# Patient Record
Sex: Male | Born: 1985 | Race: White | Hispanic: No | Marital: Single | Smoking: Light tobacco smoker
Health system: Southern US, Community
[De-identification: ages and names within clinical notes are randomized; demographics above are authoritative.]

## PROBLEM LIST (undated history)

## (undated) DIAGNOSIS — E781 Pure hyperglyceridemia: Secondary | ICD-10-CM

## (undated) DIAGNOSIS — I10 Essential (primary) hypertension: Secondary | ICD-10-CM

## (undated) DIAGNOSIS — F909 Attention-deficit hyperactivity disorder, unspecified type: Secondary | ICD-10-CM

---

## 2006-12-17 ENCOUNTER — Encounter: Admission: RE | Admit: 2006-12-17 | Discharge: 2006-12-17 | Payer: Self-pay | Admitting: Family Medicine

## 2009-04-04 ENCOUNTER — Encounter: Admission: RE | Admit: 2009-04-04 | Discharge: 2009-04-04 | Payer: Self-pay | Admitting: Occupational Medicine

## 2010-03-24 ENCOUNTER — Encounter: Admission: RE | Admit: 2010-03-24 | Discharge: 2010-03-24 | Payer: Self-pay | Admitting: Family Medicine

## 2014-10-13 ENCOUNTER — Other Ambulatory Visit: Payer: Self-pay | Admitting: Family Medicine

## 2014-10-13 DIAGNOSIS — G4484 Primary exertional headache: Secondary | ICD-10-CM

## 2014-10-15 ENCOUNTER — Ambulatory Visit
Admission: RE | Admit: 2014-10-15 | Discharge: 2014-10-15 | Disposition: A | Discharge status: Home / Self Care | Payer: BLUE CROSS/BLUE SHIELD | Source: Ambulatory Visit | Attending: Family Medicine | Admitting: Family Medicine

## 2014-10-15 DIAGNOSIS — G4484 Primary exertional headache: Secondary | ICD-10-CM

## 2014-10-21 ENCOUNTER — Other Ambulatory Visit: Payer: Self-pay | Admitting: Family Medicine

## 2014-10-21 DIAGNOSIS — G4484 Primary exertional headache: Secondary | ICD-10-CM

## 2014-11-02 ENCOUNTER — Other Ambulatory Visit: Payer: BLUE CROSS/BLUE SHIELD

## 2016-07-16 DIAGNOSIS — Z0000 Encounter for general adult medical examination without abnormal findings: Secondary | ICD-10-CM | POA: Diagnosis not present

## 2016-07-16 DIAGNOSIS — Z131 Encounter for screening for diabetes mellitus: Secondary | ICD-10-CM | POA: Diagnosis not present

## 2016-07-16 DIAGNOSIS — R5383 Other fatigue: Secondary | ICD-10-CM | POA: Diagnosis not present

## 2016-07-16 DIAGNOSIS — Z13220 Encounter for screening for lipoid disorders: Secondary | ICD-10-CM | POA: Diagnosis not present

## 2016-07-16 DIAGNOSIS — Z23 Encounter for immunization: Secondary | ICD-10-CM | POA: Diagnosis not present

## 2016-07-16 DIAGNOSIS — E782 Mixed hyperlipidemia: Secondary | ICD-10-CM | POA: Diagnosis not present

## 2016-08-22 DIAGNOSIS — E785 Hyperlipidemia, unspecified: Secondary | ICD-10-CM | POA: Diagnosis not present

## 2017-06-11 DIAGNOSIS — J209 Acute bronchitis, unspecified: Secondary | ICD-10-CM | POA: Diagnosis not present

## 2017-07-24 DIAGNOSIS — R7309 Other abnormal glucose: Secondary | ICD-10-CM | POA: Diagnosis not present

## 2017-07-24 DIAGNOSIS — E782 Mixed hyperlipidemia: Secondary | ICD-10-CM | POA: Diagnosis not present

## 2017-07-24 DIAGNOSIS — E781 Pure hyperglyceridemia: Secondary | ICD-10-CM | POA: Diagnosis not present

## 2017-07-24 DIAGNOSIS — Z0000 Encounter for general adult medical examination without abnormal findings: Secondary | ICD-10-CM | POA: Diagnosis not present

## 2017-09-04 DIAGNOSIS — E785 Hyperlipidemia, unspecified: Secondary | ICD-10-CM | POA: Diagnosis not present

## 2018-07-29 DIAGNOSIS — R7309 Other abnormal glucose: Secondary | ICD-10-CM | POA: Diagnosis not present

## 2018-07-29 DIAGNOSIS — E782 Mixed hyperlipidemia: Secondary | ICD-10-CM | POA: Diagnosis not present

## 2018-07-29 DIAGNOSIS — Z0000 Encounter for general adult medical examination without abnormal findings: Secondary | ICD-10-CM | POA: Diagnosis not present

## 2018-09-05 DIAGNOSIS — F419 Anxiety disorder, unspecified: Secondary | ICD-10-CM | POA: Diagnosis not present

## 2018-09-05 DIAGNOSIS — R41840 Attention and concentration deficit: Secondary | ICD-10-CM | POA: Diagnosis not present

## 2018-10-03 DIAGNOSIS — F902 Attention-deficit hyperactivity disorder, combined type: Secondary | ICD-10-CM | POA: Diagnosis not present

## 2018-10-03 DIAGNOSIS — E785 Hyperlipidemia, unspecified: Secondary | ICD-10-CM | POA: Diagnosis not present

## 2018-10-03 DIAGNOSIS — Z79899 Other long term (current) drug therapy: Secondary | ICD-10-CM | POA: Diagnosis not present

## 2018-10-03 DIAGNOSIS — I10 Essential (primary) hypertension: Secondary | ICD-10-CM | POA: Diagnosis not present

## 2018-12-26 DIAGNOSIS — Z79899 Other long term (current) drug therapy: Secondary | ICD-10-CM | POA: Diagnosis not present

## 2018-12-26 DIAGNOSIS — F902 Attention-deficit hyperactivity disorder, combined type: Secondary | ICD-10-CM | POA: Diagnosis not present

## 2019-01-27 DIAGNOSIS — R062 Wheezing: Secondary | ICD-10-CM | POA: Diagnosis not present

## 2019-01-27 DIAGNOSIS — J988 Other specified respiratory disorders: Secondary | ICD-10-CM | POA: Diagnosis not present

## 2019-01-27 DIAGNOSIS — R05 Cough: Secondary | ICD-10-CM | POA: Diagnosis not present

## 2019-03-27 DIAGNOSIS — E785 Hyperlipidemia, unspecified: Secondary | ICD-10-CM | POA: Diagnosis not present

## 2019-04-24 DIAGNOSIS — Z79899 Other long term (current) drug therapy: Secondary | ICD-10-CM | POA: Diagnosis not present

## 2019-04-24 DIAGNOSIS — F419 Anxiety disorder, unspecified: Secondary | ICD-10-CM | POA: Diagnosis not present

## 2019-04-24 DIAGNOSIS — F902 Attention-deficit hyperactivity disorder, combined type: Secondary | ICD-10-CM | POA: Diagnosis not present

## 2019-06-12 DIAGNOSIS — M791 Myalgia, unspecified site: Secondary | ICD-10-CM | POA: Diagnosis not present

## 2019-06-12 DIAGNOSIS — R05 Cough: Secondary | ICD-10-CM | POA: Diagnosis not present

## 2019-06-15 DIAGNOSIS — R05 Cough: Secondary | ICD-10-CM | POA: Diagnosis not present

## 2019-06-19 DIAGNOSIS — U071 COVID-19: Secondary | ICD-10-CM | POA: Diagnosis not present

## 2019-07-03 ENCOUNTER — Emergency Department (HOSPITAL_BASED_OUTPATIENT_CLINIC_OR_DEPARTMENT_OTHER)
Admission: EM | Admit: 2019-07-03 | Discharge: 2019-07-03 | Disposition: A | Discharge status: Home / Self Care | Payer: BC Managed Care – PPO | Attending: Emergency Medicine | Admitting: Emergency Medicine

## 2019-07-03 ENCOUNTER — Other Ambulatory Visit: Payer: Self-pay

## 2019-07-03 ENCOUNTER — Encounter (HOSPITAL_BASED_OUTPATIENT_CLINIC_OR_DEPARTMENT_OTHER): Payer: Self-pay | Admitting: Emergency Medicine

## 2019-07-03 ENCOUNTER — Emergency Department (HOSPITAL_BASED_OUTPATIENT_CLINIC_OR_DEPARTMENT_OTHER): Payer: BC Managed Care – PPO

## 2019-07-03 DIAGNOSIS — R0609 Other forms of dyspnea: Secondary | ICD-10-CM

## 2019-07-03 DIAGNOSIS — Z79899 Other long term (current) drug therapy: Secondary | ICD-10-CM | POA: Insufficient documentation

## 2019-07-03 DIAGNOSIS — F17210 Nicotine dependence, cigarettes, uncomplicated: Secondary | ICD-10-CM | POA: Diagnosis not present

## 2019-07-03 DIAGNOSIS — M940 Chondrocostal junction syndrome [Tietze]: Secondary | ICD-10-CM

## 2019-07-03 DIAGNOSIS — R0602 Shortness of breath: Secondary | ICD-10-CM | POA: Diagnosis not present

## 2019-07-03 DIAGNOSIS — R072 Precordial pain: Secondary | ICD-10-CM | POA: Diagnosis not present

## 2019-07-03 DIAGNOSIS — Z20828 Contact with and (suspected) exposure to other viral communicable diseases: Secondary | ICD-10-CM | POA: Insufficient documentation

## 2019-07-03 DIAGNOSIS — I10 Essential (primary) hypertension: Secondary | ICD-10-CM | POA: Diagnosis not present

## 2019-07-03 DIAGNOSIS — R0600 Dyspnea, unspecified: Secondary | ICD-10-CM | POA: Diagnosis not present

## 2019-07-03 DIAGNOSIS — R079 Chest pain, unspecified: Secondary | ICD-10-CM | POA: Insufficient documentation

## 2019-07-03 DIAGNOSIS — U071 COVID-19: Secondary | ICD-10-CM

## 2019-07-03 HISTORY — DX: Pure hyperglyceridemia: E78.1

## 2019-07-03 HISTORY — DX: Essential (primary) hypertension: I10

## 2019-07-03 HISTORY — DX: Attention-deficit hyperactivity disorder, unspecified type: F90.9

## 2019-07-03 LAB — BASIC METABOLIC PANEL
Anion gap: 8 (ref 5–15)
BUN: 15 mg/dL (ref 6–20)
CO2: 27 mmol/L (ref 22–32)
Calcium: 9.7 mg/dL (ref 8.9–10.3)
Chloride: 103 mmol/L (ref 98–111)
Creatinine, Ser: 0.99 mg/dL (ref 0.61–1.24)
GFR calc Af Amer: >60 mL/min (ref >60)
GFR calc non Af Amer: >60 mL/min (ref >60)
Glucose, Bld: 119 mg/dL — ABNORMAL HIGH (ref 70–99)
Potassium: 4.4 mmol/L (ref 3.5–5.1)
Sodium: 138 mmol/L (ref 135–145)

## 2019-07-03 LAB — URINALYSIS, ROUTINE W REFLEX MICROSCOPIC
Bilirubin Urine: NEGATIVE
Glucose, UA: NEGATIVE mg/dL
Hgb urine dipstick: NEGATIVE
Ketones, ur: NEGATIVE mg/dL
Leukocytes,Ua: NEGATIVE
Nitrite: NEGATIVE
Protein, ur: NEGATIVE mg/dL
Specific Gravity, Urine: <1.005 — ABNORMAL LOW (ref 1.005–1.030)
pH: 6 (ref 5.0–8.0)

## 2019-07-03 LAB — CBC WITH DIFFERENTIAL/PLATELET
Abs Immature Granulocytes: 0.01 10*3/uL (ref 0.00–0.07)
Basophils Absolute: 0 10*3/uL (ref 0.0–0.1)
Basophils Relative: 1 %
Eosinophils Absolute: 0.1 10*3/uL (ref 0.0–0.5)
Eosinophils Relative: 2 %
HCT: 40.3 % (ref 39.0–52.0)
Hemoglobin: 13.9 g/dL (ref 13.0–17.0)
Immature Granulocytes: 0 %
Lymphocytes Relative: 20 %
Lymphs Abs: 1 10*3/uL (ref 0.7–4.0)
MCH: 28.6 pg (ref 26.0–34.0)
MCHC: 34.5 g/dL (ref 30.0–36.0)
MCV: 82.9 fL (ref 80.0–100.0)
Monocytes Absolute: 0.4 10*3/uL (ref 0.1–1.0)
Monocytes Relative: 8 %
Neutro Abs: 3.4 10*3/uL (ref 1.7–7.7)
Neutrophils Relative %: 69 %
Platelets: 216 10*3/uL (ref 150–400)
RBC: 4.86 MIL/uL (ref 4.22–5.81)
RDW: 12.1 % (ref 11.5–15.5)
WBC: 5 10*3/uL (ref 4.0–10.5)
nRBC: 0 % (ref 0.0–0.2)

## 2019-07-03 LAB — TROPONIN I (HIGH SENSITIVITY): Troponin I (High Sensitivity): <2 ng/L (ref <18)

## 2019-07-03 LAB — D-DIMER, QUANTITATIVE: D-Dimer, Quant: 0.29 ug/mL-FEU (ref 0.00–0.50)

## 2019-07-03 NOTE — ED Provider Notes (Signed)
[facility]PART[facility] Provider Note   CSN: [id number] Arrival date & time: 07/03/19  1030     History   Chief Complaint No chief complaint on file.   HPI Lawrence Anderson is a 33 y.o. male with PMHx HTN who presents to the ED today complaining of gradual onset, constant, dull/achy, substernal chest pain x 2-3 days. Pt also complains of dyspnea on exertion.  Patient reports that he tested positive for Covid on 10/19.  He reports that 3 days prior (10/16) he began having a gradual cough and body aches which led him to go get tested.  He states that his symptoms improved about 10 days out after his test and he felt fine until earlier this week when he began having dull/achy pain to his chest.  He does report that the chest pain seems to worsen with deep inspiration.  No positional changes.  Ports he has never had pain like this in the past.  He does endorse family history of CAD.  Patient is a non-smoker.  He does endorse that he has been more immobile with his COVID-19 positive status.  Denies fever, chills, swelling, diaphoresis, nausea, vomiting, any other associated symptoms.  No history of DVT/PE.  No hemoptysis.  No active malignancy.  No exogenous hormone use.       Past Medical History:  Diagnosis Date  . ADHD   . High triglycerides   . Hypertension     There are no active problems to display for this patient.   History reviewed. No pertinent surgical history.      Home Medications    Prior to Admission medications   Medication Sig Start Date End Date Taking? Authorizing Provider  amphetamine-dextroamphetamine (ADDERALL XR) 30 MG 24 hr capsule Take 30 mg by mouth daily. 06/23/19  Yes [provider]  atorvastatin (LIPITOR) 10 MG tablet Take 10 mg by mouth daily.   Yes [provider]  lisinopril (ZESTRIL) 5 MG tablet Take 10 mg by mouth daily.    Yes [provider]    Family History No family history on file.  Social  History Social History   Tobacco Use  . Smoking status: Light Tobacco Smoker    Types: Cigars  . Smokeless tobacco: Never Used  . Tobacco comment: smokes x 2times a year  Substance Use Topics  . Alcohol use: Yes    Comment: social  . Drug use: Never     Allergies   Patient has no known allergies.   Review of Systems Review of Systems  Constitutional: Negative for chills and fever.  HENT: Negative for congestion.   Eyes: Negative for visual disturbance.  Respiratory: Positive for shortness of breath.   Cardiovascular: Positive for chest pain. Negative for palpitations and leg swelling.  Gastrointestinal: Negative for abdominal pain, nausea and vomiting.  Genitourinary: Negative for difficulty urinating.  Musculoskeletal: Negative for myalgias.  Skin: Negative for rash.  Neurological: Negative for syncope and headaches.     Physical Exam Updated Vital Signs BP (!) 133/91   Pulse 75   Temp 98.7 F (37.1 C) (Oral)   Resp 12   Ht 5\' 10"  (1.778 m)   Wt 113.4 kg   SpO2 99%   BMI 35.86 kg/m   Physical Exam Vitals signs and nursing note reviewed.  Constitutional:      Appearance: He is not ill-appearing or diaphoretic.  HENT:     Head: Normocephalic and atraumatic.  Eyes:     Conjunctiva/sclera:  Conjunctivae normal.  Neck:     Musculoskeletal: Neck supple.  Cardiovascular:     Rate and Rhythm: Normal rate and regular rhythm.     Pulses: Normal pulses.  Pulmonary:     Effort: Pulmonary effort is normal.     Breath sounds: Normal breath sounds. No wheezing, rhonchi or rales.     Comments: Satting 100% on RA. Able to speak in full sentences without difficulty.  Chest:     Chest wall: No tenderness.  Abdominal:     Palpations: Abdomen is soft.     Tenderness: There is no abdominal tenderness. There is no guarding or rebound.  Musculoskeletal:     Right lower leg: No edema.     Left lower leg: No edema.  Skin:    General: Skin is warm and dry.   Neurological:     Mental Status: He is alert.      ED Treatments / Results  Labs (all labs ordered are listed, but only abnormal results are displayed) Labs Reviewed  BASIC METABOLIC PANEL - Abnormal; Notable for the following components:      Result Value   Glucose, Bld 119 (*)    All other components within normal limits  URINALYSIS, ROUTINE W REFLEX MICROSCOPIC - Abnormal; Notable for the following components:   Color, Urine STRAW (*)    Specific Gravity, Urine <1.005 (*)    All other components within normal limits  CBC WITH DIFFERENTIAL/PLATELET  D-DIMER, QUANTITATIVE (NOT AT [facility])  TROPONIN I (HIGH SENSITIVITY)    EKG EKG Interpretation  Date/Time:  Friday July 03 2019 10:41:07 EST Ventricular Rate:  78 PR Interval:    QRS Duration: 97 QT Interval:  350 QTC Calculation: 399 R Axis:   28 Text Interpretation: Sinus rhythm ST elev, probable normal early repol pattern No previous ECGs available Confirmed by Frederick PeersLittle, Rachel [phone]([id number]) on 07/03/2019 10:51:40 AM   Radiology Dg Chest Portable 1 View  Result Date: 07/03/2019 CLINICAL DATA:  Shortness of breath and chest pain EXAM: PORTABLE CHEST 1 VIEW COMPARISON:  April 04, 2009 FINDINGS: Lungs are clear. Heart size and pulmonary vascularity are normal. No adenopathy. No bone lesions. No pneumothorax. IMPRESSION: No edema or consolidation. Electronically Signed   By: Bretta BangWilliam  Woodruff III M.D.   On: 07/03/2019 11:11    Procedures Procedures (including critical care time)  Medications Ordered in ED Medications - No data to display   Initial Impression / Assessment and Plan / ED Course  I have reviewed the triage vital signs and the nursing notes.  Pertinent labs & imaging results that were available during my care of the patient were reviewed by me and considered in my medical decision making (see chart for details).    33 year old male who presents the ED with complaints of dyspnea on exertion and tightness  substernally for the past 2 to 3 days.  Was tested positive for COVID-19 on 10/19 after cough, body aches.  Reports that he began feeling better about 1.5 weeks ago but a couple days ago started noticing dyspnea on exertion again.  Patient also has an occasional cough.  Exam patient appears comfortable.  He is able to speak in full sentences without accessory muscle use and satting 100% on room air.  He is afebrile today without tachycardia or tachypnea.  Patient describes the chest pain as a dull/achy sensation although does report it is worse with deep inspiration.  No tenderness to palpation on exam.  His lungs are clear to auscultation bilaterally.  Portable chest x-ray obtained prior to being seen - negative.  Given complaints of chest pain with recent COVID-19 infection will rule out ACS as well as PE today with D-dimer.   EKG without ischemic changes.  Again chest x-ray clear.  No electrolyte abnormalities.  No leukocytosis.  D-dimer negative. Do Not feel patient needs additional work-up with CTA at this time. Troponin less than 2.  Heart score of 2.  Do not feel patient needs additional troponin at this time.  Essentially negative work-up in the ED today.   Discussed these findings with patient.  I was initially planning on prescribing an albuterol inhaler as he has a slight cough in the room.  Patient states that he has an active inhaler but has not been using it as it makes him "jittery."  Advised to use this as needed.  Advised Tylenol as needed for the dull pain in his chest.  Patient may be experiencing some costochondritis from continued coughing from COVID-19.  Advised that he follow-up with his PCP as well.  It appears that he tried to make an appointment today but they sent him to the ED for further evaluation.  Return precautions have been discussed with patient.  He is in agreement with plan at this time stable for discharge home.   This note was prepared using Dragon voice recognition  software and may include unintentional dictation errors due to the inherent limitations of voice recognition software.        Final Clinical Impressions(s) / ED Diagnoses   Final diagnoses:  Nonspecific chest pain  Costochondritis  Dyspnea on exertion  COVID-19    ED Discharge Orders    None       Tanda Rockers, PA-C 07/03/19 1309    Little, Ambrose [place], MD 07/03/19 1409

## 2019-07-03 NOTE — ED Notes (Signed)
XR at bedside

## 2019-07-03 NOTE — ED Triage Notes (Signed)
Fatigue, with SOB on exertion, since Monday. Having a 'tightness' in chest x 3 days. No fever or cough. Dx with +COVID test on 06/15/19

## 2019-07-03 NOTE — Discharge Instructions (Signed)
Your labwork, EKG, and chest xray were all very reassuring today Please use your inhaler as needed for your shortness of breath. Continue taking OTC medications for your cough.  Take Tylenol for your dull chest pain as this may be related to inflammation from coughing.  Follow up with your PCP regarding your ED visit today

## 2019-07-21 DIAGNOSIS — Z79899 Other long term (current) drug therapy: Secondary | ICD-10-CM | POA: Diagnosis not present

## 2019-07-21 DIAGNOSIS — F902 Attention-deficit hyperactivity disorder, combined type: Secondary | ICD-10-CM | POA: Diagnosis not present

## 2019-08-24 DIAGNOSIS — E782 Mixed hyperlipidemia: Secondary | ICD-10-CM | POA: Diagnosis not present

## 2019-08-24 DIAGNOSIS — R7309 Other abnormal glucose: Secondary | ICD-10-CM | POA: Diagnosis not present

## 2019-08-24 DIAGNOSIS — Z0000 Encounter for general adult medical examination without abnormal findings: Secondary | ICD-10-CM | POA: Diagnosis not present

## 2019-08-24 DIAGNOSIS — R41840 Attention and concentration deficit: Secondary | ICD-10-CM | POA: Diagnosis not present

## 2019-09-14 DIAGNOSIS — R7309 Other abnormal glucose: Secondary | ICD-10-CM | POA: Diagnosis not present

## 2019-09-14 DIAGNOSIS — Z0000 Encounter for general adult medical examination without abnormal findings: Secondary | ICD-10-CM | POA: Diagnosis not present

## 2019-09-14 DIAGNOSIS — Z13220 Encounter for screening for lipoid disorders: Secondary | ICD-10-CM | POA: Diagnosis not present

## 2020-02-25 DIAGNOSIS — I10 Essential (primary) hypertension: Secondary | ICD-10-CM | POA: Diagnosis not present

## 2020-02-25 DIAGNOSIS — F909 Attention-deficit hyperactivity disorder, unspecified type: Secondary | ICD-10-CM | POA: Diagnosis not present

## 2020-02-25 DIAGNOSIS — E785 Hyperlipidemia, unspecified: Secondary | ICD-10-CM | POA: Diagnosis not present

## 2020-02-25 DIAGNOSIS — R7301 Impaired fasting glucose: Secondary | ICD-10-CM | POA: Diagnosis not present

## 2020-04-05 IMAGING — DX DG CHEST 1V PORT
1 series · 1 of 1 positions shown · non-contrast
Comparison: April 04, 2009

CLINICAL DATA: Shortness of breath and chest pain

EXAM:
PORTABLE CHEST 1 VIEW

[chest ap]
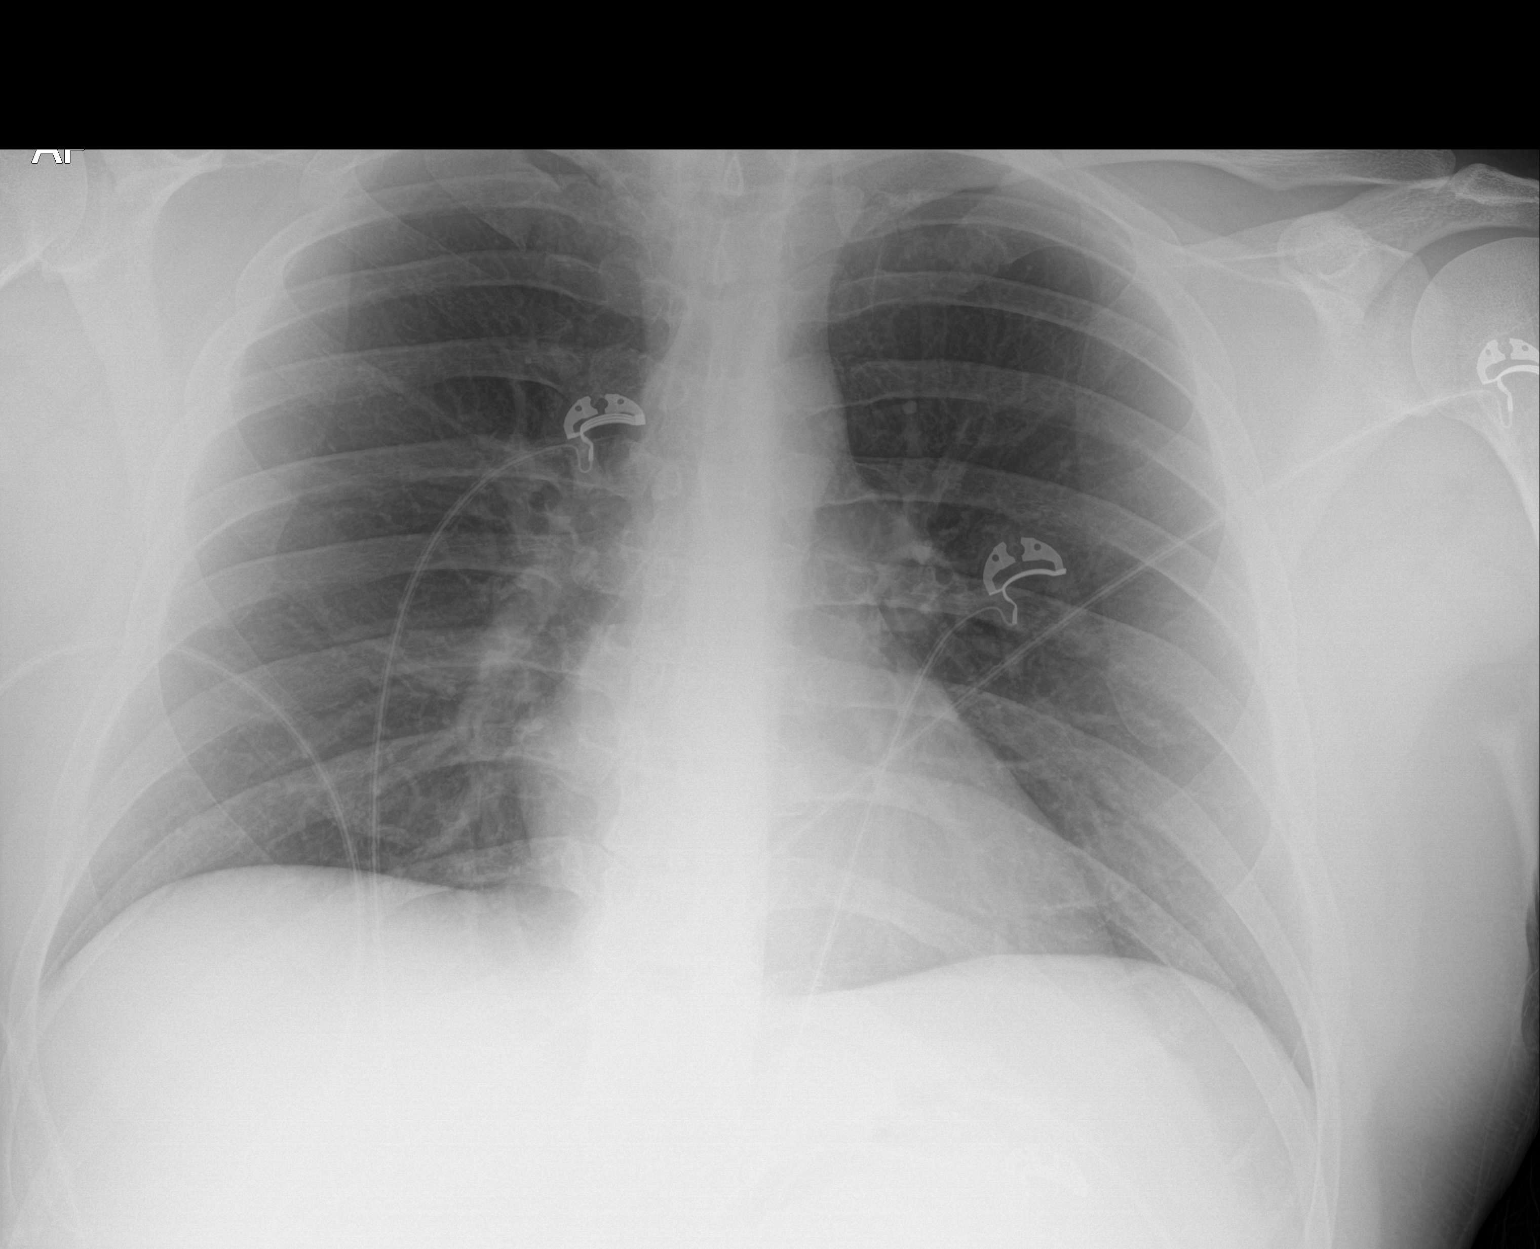

[1 of 1 positions shown; findings below may reference images not displayed]

FINDINGS: Lungs are clear. Heart size and pulmonary vascularity are normal. No
adenopathy. No bone lesions. No pneumothorax.
IMPRESSION: No edema or consolidation.

## 2020-09-05 DIAGNOSIS — Z1152 Encounter for screening for COVID-19: Secondary | ICD-10-CM | POA: Diagnosis not present

## 2020-09-15 DIAGNOSIS — Z0000 Encounter for general adult medical examination without abnormal findings: Secondary | ICD-10-CM | POA: Diagnosis not present

## 2020-09-15 DIAGNOSIS — E1169 Type 2 diabetes mellitus with other specified complication: Secondary | ICD-10-CM | POA: Diagnosis not present

## 2020-09-15 DIAGNOSIS — E785 Hyperlipidemia, unspecified: Secondary | ICD-10-CM | POA: Diagnosis not present

## 2021-03-15 DIAGNOSIS — E785 Hyperlipidemia, unspecified: Secondary | ICD-10-CM | POA: Diagnosis not present

## 2021-03-15 DIAGNOSIS — I10 Essential (primary) hypertension: Secondary | ICD-10-CM | POA: Diagnosis not present

## 2021-03-15 DIAGNOSIS — F909 Attention-deficit hyperactivity disorder, unspecified type: Secondary | ICD-10-CM | POA: Diagnosis not present

## 2021-03-15 DIAGNOSIS — E1169 Type 2 diabetes mellitus with other specified complication: Secondary | ICD-10-CM | POA: Diagnosis not present

## 2021-10-02 DIAGNOSIS — K921 Melena: Secondary | ICD-10-CM | POA: Diagnosis not present

## 2021-10-02 DIAGNOSIS — I10 Essential (primary) hypertension: Secondary | ICD-10-CM | POA: Diagnosis not present

## 2021-10-02 DIAGNOSIS — E1169 Type 2 diabetes mellitus with other specified complication: Secondary | ICD-10-CM | POA: Diagnosis not present

## 2021-10-02 DIAGNOSIS — E785 Hyperlipidemia, unspecified: Secondary | ICD-10-CM | POA: Diagnosis not present

## 2021-10-02 DIAGNOSIS — Z0000 Encounter for general adult medical examination without abnormal findings: Secondary | ICD-10-CM | POA: Diagnosis not present

## 2021-11-09 DIAGNOSIS — K639 Disease of intestine, unspecified: Secondary | ICD-10-CM | POA: Diagnosis not present

## 2021-11-09 DIAGNOSIS — K625 Hemorrhage of anus and rectum: Secondary | ICD-10-CM | POA: Diagnosis not present

## 2022-01-05 DIAGNOSIS — J4521 Mild intermittent asthma with (acute) exacerbation: Secondary | ICD-10-CM | POA: Diagnosis not present

## 2022-02-14 DIAGNOSIS — D123 Benign neoplasm of transverse colon: Secondary | ICD-10-CM | POA: Diagnosis not present

## 2022-02-14 DIAGNOSIS — K5289 Other specified noninfective gastroenteritis and colitis: Secondary | ICD-10-CM | POA: Diagnosis not present

## 2022-02-14 DIAGNOSIS — K621 Rectal polyp: Secondary | ICD-10-CM | POA: Diagnosis not present

## 2022-02-14 DIAGNOSIS — K5730 Diverticulosis of large intestine without perforation or abscess without bleeding: Secondary | ICD-10-CM | POA: Diagnosis not present

## 2022-02-14 DIAGNOSIS — K625 Hemorrhage of anus and rectum: Secondary | ICD-10-CM | POA: Diagnosis not present

## 2022-02-14 DIAGNOSIS — D125 Benign neoplasm of sigmoid colon: Secondary | ICD-10-CM | POA: Diagnosis not present

## 2022-02-14 DIAGNOSIS — R194 Change in bowel habit: Secondary | ICD-10-CM | POA: Diagnosis not present

## 2022-02-14 DIAGNOSIS — K644 Residual hemorrhoidal skin tags: Secondary | ICD-10-CM | POA: Diagnosis not present

## 2022-02-14 DIAGNOSIS — K648 Other hemorrhoids: Secondary | ICD-10-CM | POA: Diagnosis not present

## 2022-02-14 DIAGNOSIS — K529 Noninfective gastroenteritis and colitis, unspecified: Secondary | ICD-10-CM | POA: Diagnosis not present

## 2022-02-14 DIAGNOSIS — D124 Benign neoplasm of descending colon: Secondary | ICD-10-CM | POA: Diagnosis not present

## 2022-02-26 DIAGNOSIS — R200 Anesthesia of skin: Secondary | ICD-10-CM | POA: Diagnosis not present

## 2022-02-26 DIAGNOSIS — R053 Chronic cough: Secondary | ICD-10-CM | POA: Diagnosis not present

## 2022-02-26 DIAGNOSIS — E1159 Type 2 diabetes mellitus with other circulatory complications: Secondary | ICD-10-CM | POA: Diagnosis not present

## 2022-04-03 DIAGNOSIS — I10 Essential (primary) hypertension: Secondary | ICD-10-CM | POA: Diagnosis not present

## 2022-04-03 DIAGNOSIS — F909 Attention-deficit hyperactivity disorder, unspecified type: Secondary | ICD-10-CM | POA: Diagnosis not present

## 2022-04-03 DIAGNOSIS — E1169 Type 2 diabetes mellitus with other specified complication: Secondary | ICD-10-CM | POA: Diagnosis not present

## 2022-04-03 DIAGNOSIS — E785 Hyperlipidemia, unspecified: Secondary | ICD-10-CM | POA: Diagnosis not present

## 2022-04-10 DIAGNOSIS — J709 Respiratory conditions due to unspecified external agent: Secondary | ICD-10-CM | POA: Diagnosis not present

## 2022-04-10 DIAGNOSIS — R052 Subacute cough: Secondary | ICD-10-CM | POA: Diagnosis not present

## 2022-04-10 DIAGNOSIS — J3089 Other allergic rhinitis: Secondary | ICD-10-CM | POA: Diagnosis not present

## 2022-04-18 DIAGNOSIS — I10 Essential (primary) hypertension: Secondary | ICD-10-CM | POA: Diagnosis not present

## 2022-04-18 DIAGNOSIS — E669 Obesity, unspecified: Secondary | ICD-10-CM | POA: Diagnosis not present

## 2022-04-18 DIAGNOSIS — G4719 Other hypersomnia: Secondary | ICD-10-CM | POA: Diagnosis not present

## 2022-04-18 DIAGNOSIS — E785 Hyperlipidemia, unspecified: Secondary | ICD-10-CM | POA: Diagnosis not present

## 2022-05-29 DIAGNOSIS — G4733 Obstructive sleep apnea (adult) (pediatric): Secondary | ICD-10-CM | POA: Diagnosis not present

## 2022-06-25 DIAGNOSIS — I10 Essential (primary) hypertension: Secondary | ICD-10-CM | POA: Diagnosis not present

## 2022-06-25 DIAGNOSIS — G4733 Obstructive sleep apnea (adult) (pediatric): Secondary | ICD-10-CM | POA: Diagnosis not present

## 2022-07-24 DIAGNOSIS — J069 Acute upper respiratory infection, unspecified: Secondary | ICD-10-CM | POA: Diagnosis not present

## 2022-07-24 DIAGNOSIS — R059 Cough, unspecified: Secondary | ICD-10-CM | POA: Diagnosis not present

## 2022-07-24 DIAGNOSIS — R062 Wheezing: Secondary | ICD-10-CM | POA: Diagnosis not present

## 2022-07-26 DIAGNOSIS — G4733 Obstructive sleep apnea (adult) (pediatric): Secondary | ICD-10-CM | POA: Diagnosis not present

## 2022-07-26 DIAGNOSIS — I10 Essential (primary) hypertension: Secondary | ICD-10-CM | POA: Diagnosis not present

## 2022-07-27 ENCOUNTER — Other Ambulatory Visit: Payer: Self-pay | Admitting: Allergy

## 2022-07-27 ENCOUNTER — Ambulatory Visit
Admission: RE | Admit: 2022-07-27 | Discharge: 2022-07-27 | Disposition: A | Discharge status: Home / Self Care | Payer: BC Managed Care – PPO | Source: Ambulatory Visit | Attending: Allergy | Admitting: Allergy

## 2022-07-27 DIAGNOSIS — R052 Subacute cough: Secondary | ICD-10-CM | POA: Diagnosis not present

## 2022-07-27 DIAGNOSIS — J300 Vasomotor rhinitis: Secondary | ICD-10-CM | POA: Diagnosis not present

## 2022-07-27 DIAGNOSIS — R059 Cough, unspecified: Secondary | ICD-10-CM | POA: Diagnosis not present

## 2022-08-25 DIAGNOSIS — I10 Essential (primary) hypertension: Secondary | ICD-10-CM | POA: Diagnosis not present

## 2022-08-25 DIAGNOSIS — G4733 Obstructive sleep apnea (adult) (pediatric): Secondary | ICD-10-CM | POA: Diagnosis not present

## 2022-09-04 DIAGNOSIS — I10 Essential (primary) hypertension: Secondary | ICD-10-CM | POA: Diagnosis not present

## 2022-09-04 DIAGNOSIS — G4733 Obstructive sleep apnea (adult) (pediatric): Secondary | ICD-10-CM | POA: Diagnosis not present

## 2022-09-25 DIAGNOSIS — G4733 Obstructive sleep apnea (adult) (pediatric): Secondary | ICD-10-CM | POA: Diagnosis not present

## 2022-09-25 DIAGNOSIS — I10 Essential (primary) hypertension: Secondary | ICD-10-CM | POA: Diagnosis not present

## 2022-10-19 DIAGNOSIS — I10 Essential (primary) hypertension: Secondary | ICD-10-CM | POA: Diagnosis not present

## 2022-10-19 DIAGNOSIS — E1169 Type 2 diabetes mellitus with other specified complication: Secondary | ICD-10-CM | POA: Diagnosis not present

## 2022-10-19 DIAGNOSIS — E785 Hyperlipidemia, unspecified: Secondary | ICD-10-CM | POA: Diagnosis not present

## 2022-10-19 DIAGNOSIS — Z0000 Encounter for general adult medical examination without abnormal findings: Secondary | ICD-10-CM | POA: Diagnosis not present

## 2022-10-25 DIAGNOSIS — G4733 Obstructive sleep apnea (adult) (pediatric): Secondary | ICD-10-CM | POA: Diagnosis not present

## 2022-10-25 DIAGNOSIS — I10 Essential (primary) hypertension: Secondary | ICD-10-CM | POA: Diagnosis not present

## 2022-11-08 DIAGNOSIS — M25562 Pain in left knee: Secondary | ICD-10-CM | POA: Diagnosis not present

## 2022-11-24 DIAGNOSIS — I10 Essential (primary) hypertension: Secondary | ICD-10-CM | POA: Diagnosis not present

## 2022-11-24 DIAGNOSIS — G4733 Obstructive sleep apnea (adult) (pediatric): Secondary | ICD-10-CM | POA: Diagnosis not present

## 2022-12-25 DIAGNOSIS — G4733 Obstructive sleep apnea (adult) (pediatric): Secondary | ICD-10-CM | POA: Diagnosis not present

## 2022-12-25 DIAGNOSIS — I10 Essential (primary) hypertension: Secondary | ICD-10-CM | POA: Diagnosis not present

## 2023-01-24 DIAGNOSIS — G4733 Obstructive sleep apnea (adult) (pediatric): Secondary | ICD-10-CM | POA: Diagnosis not present

## 2023-01-24 DIAGNOSIS — I10 Essential (primary) hypertension: Secondary | ICD-10-CM | POA: Diagnosis not present

## 2023-02-24 DIAGNOSIS — G4733 Obstructive sleep apnea (adult) (pediatric): Secondary | ICD-10-CM | POA: Diagnosis not present

## 2023-02-24 DIAGNOSIS — I10 Essential (primary) hypertension: Secondary | ICD-10-CM | POA: Diagnosis not present

## 2023-03-19 DIAGNOSIS — I10 Essential (primary) hypertension: Secondary | ICD-10-CM | POA: Diagnosis not present

## 2023-03-19 DIAGNOSIS — G4733 Obstructive sleep apnea (adult) (pediatric): Secondary | ICD-10-CM | POA: Diagnosis not present

## 2023-03-26 DIAGNOSIS — I10 Essential (primary) hypertension: Secondary | ICD-10-CM | POA: Diagnosis not present

## 2023-03-26 DIAGNOSIS — G4733 Obstructive sleep apnea (adult) (pediatric): Secondary | ICD-10-CM | POA: Diagnosis not present

## 2023-04-19 DIAGNOSIS — D649 Anemia, unspecified: Secondary | ICD-10-CM | POA: Diagnosis not present

## 2023-04-19 DIAGNOSIS — I10 Essential (primary) hypertension: Secondary | ICD-10-CM | POA: Diagnosis not present

## 2023-04-19 DIAGNOSIS — F909 Attention-deficit hyperactivity disorder, unspecified type: Secondary | ICD-10-CM | POA: Diagnosis not present

## 2023-04-19 DIAGNOSIS — E785 Hyperlipidemia, unspecified: Secondary | ICD-10-CM | POA: Diagnosis not present

## 2023-04-19 DIAGNOSIS — E1169 Type 2 diabetes mellitus with other specified complication: Secondary | ICD-10-CM | POA: Diagnosis not present

## 2023-07-12 DIAGNOSIS — D649 Anemia, unspecified: Secondary | ICD-10-CM | POA: Diagnosis not present

## 2023-07-12 DIAGNOSIS — E538 Deficiency of other specified B group vitamins: Secondary | ICD-10-CM | POA: Diagnosis not present

## 2023-10-29 DIAGNOSIS — E1169 Type 2 diabetes mellitus with other specified complication: Secondary | ICD-10-CM | POA: Diagnosis not present

## 2023-10-29 DIAGNOSIS — E785 Hyperlipidemia, unspecified: Secondary | ICD-10-CM | POA: Diagnosis not present

## 2023-10-29 DIAGNOSIS — Z23 Encounter for immunization: Secondary | ICD-10-CM | POA: Diagnosis not present

## 2023-10-29 DIAGNOSIS — E538 Deficiency of other specified B group vitamins: Secondary | ICD-10-CM | POA: Diagnosis not present

## 2023-10-29 DIAGNOSIS — Z0000 Encounter for general adult medical examination without abnormal findings: Secondary | ICD-10-CM | POA: Diagnosis not present

## 2024-01-09 DIAGNOSIS — E785 Hyperlipidemia, unspecified: Secondary | ICD-10-CM | POA: Diagnosis not present

## 2024-03-30 DIAGNOSIS — J40 Bronchitis, not specified as acute or chronic: Secondary | ICD-10-CM | POA: Diagnosis not present

## 2024-03-31 DIAGNOSIS — G4733 Obstructive sleep apnea (adult) (pediatric): Secondary | ICD-10-CM | POA: Diagnosis not present

## 2024-04-07 DIAGNOSIS — R053 Chronic cough: Secondary | ICD-10-CM | POA: Diagnosis not present

## 2024-05-01 DIAGNOSIS — F909 Attention-deficit hyperactivity disorder, unspecified type: Secondary | ICD-10-CM | POA: Diagnosis not present

## 2024-05-01 DIAGNOSIS — E538 Deficiency of other specified B group vitamins: Secondary | ICD-10-CM | POA: Diagnosis not present

## 2024-05-01 DIAGNOSIS — I10 Essential (primary) hypertension: Secondary | ICD-10-CM | POA: Diagnosis not present

## 2024-05-01 DIAGNOSIS — E1169 Type 2 diabetes mellitus with other specified complication: Secondary | ICD-10-CM | POA: Diagnosis not present

## 2024-05-01 DIAGNOSIS — E785 Hyperlipidemia, unspecified: Secondary | ICD-10-CM | POA: Diagnosis not present
